# Patient Record
Sex: Female | Born: 1955 | Race: Black or African American | Hispanic: No | Marital: Single | Smoking: Former smoker
Health system: Southern US, Community
[De-identification: ages and names within clinical notes are randomized; demographics above are authoritative.]

## PROBLEM LIST (undated history)

## (undated) DIAGNOSIS — I10 Essential (primary) hypertension: Secondary | ICD-10-CM

## (undated) DIAGNOSIS — J302 Other seasonal allergic rhinitis: Secondary | ICD-10-CM

## (undated) DIAGNOSIS — T7840XA Allergy, unspecified, initial encounter: Secondary | ICD-10-CM

## (undated) HISTORY — DX: Allergy, unspecified, initial encounter: T78.40XA

---

## 1999-09-09 ENCOUNTER — Ambulatory Visit (HOSPITAL_COMMUNITY): Admission: RE | Admit: 1999-09-09 | Discharge: 1999-09-09 | Payer: Self-pay | Admitting: Family Medicine

## 2010-10-12 ENCOUNTER — Ambulatory Visit: Payer: Self-pay | Admitting: Interventional Radiology

## 2010-10-12 ENCOUNTER — Emergency Department (HOSPITAL_BASED_OUTPATIENT_CLINIC_OR_DEPARTMENT_OTHER): Admission: EM | Admit: 2010-10-12 | Discharge: 2010-10-12 | Payer: Self-pay | Admitting: Emergency Medicine

## 2014-02-27 ENCOUNTER — Encounter (HOSPITAL_BASED_OUTPATIENT_CLINIC_OR_DEPARTMENT_OTHER): Payer: Self-pay | Admitting: Emergency Medicine

## 2014-02-27 ENCOUNTER — Emergency Department (HOSPITAL_BASED_OUTPATIENT_CLINIC_OR_DEPARTMENT_OTHER)
Admission: EM | Admit: 2014-02-27 | Discharge: 2014-02-27 | Disposition: A | Discharge status: Home / Self Care | Payer: 59 | Attending: Emergency Medicine | Admitting: Emergency Medicine

## 2014-02-27 DIAGNOSIS — Z87891 Personal history of nicotine dependence: Secondary | ICD-10-CM | POA: Insufficient documentation

## 2014-02-27 DIAGNOSIS — Z79899 Other long term (current) drug therapy: Secondary | ICD-10-CM | POA: Insufficient documentation

## 2014-02-27 DIAGNOSIS — I10 Essential (primary) hypertension: Secondary | ICD-10-CM | POA: Insufficient documentation

## 2014-02-27 HISTORY — DX: Other seasonal allergic rhinitis: J30.2

## 2014-02-27 HISTORY — DX: Essential (primary) hypertension: I10

## 2014-02-27 NOTE — ED Notes (Signed)
Pt noticed the artery in the left side of her neck visibly pulsating the past two days.  Wants to be checked for excessively high bp.

## 2014-02-27 NOTE — ED Notes (Signed)
MD at bedside. 

## 2014-02-27 NOTE — ED Provider Notes (Signed)
CSN: [id number]     Arrival date & time 02/27/14  0127 History   First MD Initiated Contact with Patient 02/27/14 0144     Chief Complaint  Patient presents with  . Hypertension     (Consider location/radiation/quality/duration/timing/severity/associated sxs/prior Treatment) Patient is a 58 y.o. female presenting with hypertension. The history is provided by the patient.  Hypertension This is a chronic problem. The current episode started more than 1 week ago. The problem has not changed since onset.Pertinent negatives include no chest pain, no abdominal pain, no headaches and no shortness of breath. Associated symptoms comments: No weakness no numbness no changes in vision or speech. Nothing aggravates the symptoms. Nothing relieves the symptoms. She has tried nothing for the symptoms. The treatment provided no relief.  Patient noticed the vein in her neck pulsating and thought her BP was up and wanted it checked  Past Medical History  Diagnosis Date  . Hypertension   . Seasonal allergies    History reviewed. No pertinent past surgical history. No family history on file. History  Substance Use Topics  . Smoking status: Former Games developermoker  . Smokeless tobacco: Not on file  . Alcohol Use: No   OB History   Grav Para Term Preterm Abortions TAB SAB Ect Mult Living                 Review of Systems  Constitutional: Negative for fever and diaphoresis.  Respiratory: Negative for chest tightness and shortness of breath.   Cardiovascular: Negative for chest pain and palpitations.  Gastrointestinal: Negative for abdominal pain.  Musculoskeletal: Negative for neck pain and neck stiffness.  Neurological: Negative for syncope, speech difficulty, numbness and headaches.  All other systems reviewed and are negative.      Allergies  Review of patient's allergies indicates no known allergies.  Home Medications   Current Outpatient Rx  Name  Route  Sig  Dispense  Refill  . amLODipine  (NORVASC) 5 MG tablet   Oral   Take 5 mg by mouth daily.         Marland Kitchen. lisinopril-hydrochlorothiazide (PRINZIDE,ZESTORETIC) 20-25 MG per tablet   Oral   Take 1 tablet by mouth daily.          BP 163/84  Pulse 70  Temp(Src) 98.1 F (36.7 C) (Oral)  Resp 18  Ht 5\' 5"  (1.651 m)  Wt 192 lb (87.091 kg)  BMI 31.95 kg/m2  SpO2 100% Physical Exam  Constitutional: She is oriented to person, place, and time. She appears well-developed and well-nourished. No distress.  HENT:  Head: Normocephalic and atraumatic.  Eyes: Conjunctivae are normal. Pupils are equal, round, and reactive to light.  Neck: Normal range of motion. Neck supple. No JVD present. No tracheal deviation present. No thyromegaly present.  Veins of the neck are flat.  No bruits.    Cardiovascular: Normal rate, regular rhythm and intact distal pulses.   Pulmonary/Chest: Effort normal and breath sounds normal. No stridor. No respiratory distress. She has no wheezes. She has no rales.  Abdominal: Soft. Bowel sounds are normal. There is no tenderness. There is no rebound and no guarding.  Musculoskeletal: Normal range of motion. She exhibits no edema and no tenderness.  Lymphadenopathy:    She has no cervical adenopathy.  Neurological: She is alert and oriented to person, place, and time. She has normal reflexes. She displays normal reflexes. She exhibits normal muscle tone.  Neurovascularly intact BUE  Skin: Skin is warm and dry.  Psychiatric: She  has a normal mood and affect.    ED Course  Procedures (including critical care time) Labs Review Labs Reviewed - No data to display Imaging Review No results found.   EKG Interpretation None      MDM   Final diagnoses:  None   No pulsating veins.  Recommend outpatient ultrasound of the neck and follow up with your family doctor in 2 days.  Suspect sodium and caffeine intake may play some part.  Have advised less than 2 grams of sodium a day and no caffeine until being  seen by family doctor.  Patient verbalizes understanding and agrees to follow up  Correy Weidner K Chritopher Coster-Rasch, MD 02/27/14 [phone]

## 2014-02-27 NOTE — Discharge Instructions (Signed)
DASH Diet  The DASH diet stands for "Dietary Approaches to Stop Hypertension." It is a healthy eating plan that has been shown to reduce high blood pressure (hypertension) in as little as 14 days, while also possibly providing other significant health benefits. These other health benefits include reducing the risk of breast cancer after menopause and reducing the risk of type 2 diabetes, heart disease, colon cancer, and stroke. Health benefits also include weight loss and slowing kidney failure in patients with chronic kidney disease.   DIET GUIDELINES  · Limit salt (sodium). Your diet should contain less than 1500 mg of sodium daily.  · Limit refined or processed carbohydrates. Your diet should include mostly whole grains. Desserts and added sugars should be used sparingly.  · Include small amounts of heart-healthy fats. These types of fats include nuts, oils, and tub margarine. Limit saturated and trans fats. These fats have been shown to be harmful in the body.  CHOOSING FOODS   The following food groups are based on a 2000 calorie diet. See your Registered Dietitian for individual calorie needs.  Grains and Grain Products (6 to 8 servings daily)  · Eat More Often: Whole-wheat bread, brown rice, whole-grain or wheat pasta, quinoa, popcorn without added fat or salt (air popped).  · Eat Less Often: White bread, white pasta, white rice, cornbread.  Vegetables (4 to 5 servings daily)  · Eat More Often: Fresh, frozen, and canned vegetables. Vegetables may be raw, steamed, roasted, or grilled with a minimal amount of fat.  · Eat Less Often/Avoid: Creamed or fried vegetables. Vegetables in a cheese sauce.  Fruit (4 to 5 servings daily)  · Eat More Often: All fresh, canned (in natural juice), or frozen fruits. Dried fruits without added sugar. One hundred percent fruit juice (½ cup [237 mL] daily).  · Eat Less Often: Dried fruits with added sugar. Canned fruit in light or heavy syrup.  Lean Meats, Fish, and Poultry (2  servings or less daily. One serving is 3 to 4 oz [85-114 g]).  · Eat More Often: Ninety percent or leaner ground beef, tenderloin, sirloin. Round cuts of beef, chicken breast, turkey breast. All fish. Grill, bake, or broil your meat. Nothing should be fried.  · Eat Less Often/Avoid: Fatty cuts of meat, turkey, or chicken leg, thigh, or wing. Fried cuts of meat or fish.  Dairy (2 to 3 servings)  · Eat More Often: Low-fat or fat-free milk, low-fat plain or light yogurt, reduced-fat or part-skim cheese.  · Eat Less Often/Avoid: Milk (whole, 2%). Whole milk yogurt. Full-fat cheeses.  Nuts, Seeds, and Legumes (4 to 5 servings per week)  · Eat More Often: All without added salt.  · Eat Less Often/Avoid: Salted nuts and seeds, canned beans with added salt.  Fats and Sweets (limited)  · Eat More Often: Vegetable oils, tub margarines without trans fats, sugar-free gelatin. Mayonnaise and salad dressings.  · Eat Less Often/Avoid: Coconut oils, palm oils, butter, stick margarine, cream, half and half, cookies, candy, pie.  FOR MORE INFORMATION  The Dash Diet Eating Plan: www.dashdiet.org  Document Released: 11/12/2011 Document Revised: 02/15/2012 Document Reviewed: 11/12/2011  [employer]® Patient Information ©2014 [employer].

## 2015-12-19 ENCOUNTER — Ambulatory Visit (INDEPENDENT_AMBULATORY_CARE_PROVIDER_SITE_OTHER): Payer: BLUE CROSS/BLUE SHIELD | Admitting: Physician Assistant

## 2015-12-19 VITALS — BP 124/80 | HR 79 | Temp 98.1°F | Resp 18 | Ht 66.0 in | Wt 190.0 lb

## 2015-12-19 DIAGNOSIS — Z0000 Encounter for general adult medical examination without abnormal findings: Secondary | ICD-10-CM

## 2015-12-19 DIAGNOSIS — Z7689 Persons encountering health services in other specified circumstances: Secondary | ICD-10-CM

## 2015-12-19 NOTE — Progress Notes (Signed)
[facility] [address], [place] [id number] [phone]  Date:  12/19/2015   Name:  Katherine NewerJanice F Salas   DOB:  05-Feb-1956   MRN:  [id number]  PCP:  Altamese CarolinaMARTIN,TANYA D, MD    History of Present Illness:  Katherine Salas is a 60 y.o. female patient who [facility] for work note.   Patient reports that last Thursday, patient fell back and caught her left arm.  This was days ago.  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  She had a sore back and left hand pain.  She missed the following weekend due to her pain, and finger swelling.  This has since resolved.  Her work has advised that she be cleared by a medical provider.   She has used epsom salt for her pain, which helped to   There are no active problems to display for this patient.   Past Medical History  Diagnosis Date  . Hypertension   . Seasonal allergies   . Allergy     History reviewed. No pertinent past surgical history.  Social History  Substance Use Topics  . Smoking status: Former Games developermoker  . Smokeless tobacco: None  . Alcohol Use: No    Family History  Problem Relation Age of Onset  . Cancer Mother   . Cancer Maternal Grandfather     No Known Allergies  Medication list has been reviewed and updated.  Current Outpatient Prescriptions on File Prior to Visit  Medication Sig Dispense Refill  . lisinopril-hydrochlorothiazide (PRINZIDE,ZESTORETIC) 20-25 MG per tablet Take 1 tablet by mouth daily.    Marland Kitchen. amLODipine (NORVASC) 5 MG tablet Take 5 mg by mouth daily. Reported on 12/19/2015     No current facility-administered medications on file prior to visit.    ROS   Physical Examination: BP 124/80 mmHg  Pulse 79  Temp(Src) 98.1 F (36.7 C) (Oral)  Resp 18  Ht 5\' 6"  (1.676 m)  Wt 190 lb (86.183 kg)  BMI 30.68 kg/m2  SpO2 98% Ideal Body Weight: Weight in (lb) to have BMI = 25: 154.6  Physical Exam  Constitutional: She is oriented to person, place, and time. She appears  well-developed and well-nourished. No distress.  HENT:  Head: Normocephalic and atraumatic.  Right Ear: External ear normal.  Left Ear: External ear normal.  Eyes: Conjunctivae and EOM are normal. Pupils are equal, round, and reactive to light.  Cardiovascular: Normal rate.   Pulmonary/Chest: Effort normal. No respiratory distress.  Musculoskeletal:  No swelling, redness, or abrasions at the left hand.  Normal range of motion and strength throught   Neurological: She is alert and oriented to person, place, and time.  Skin: She is not diaphoretic.  Psychiatric: She has a normal mood and affect. Her behavior is normal.     Assessment and Plan: Katherine NewerJanice F Purnell is a 60 y.o. female who is here today for a work note after a fall and missed work.   Completed note  Return to work exam   Trena PlattStephanie Rilie Glanz, PA-C [facility] 12/19/2015 1:12 PM

## 2015-12-19 NOTE — Patient Instructions (Signed)
You are able to return to work today, or next scheduled shift.

## 2016-07-08 ENCOUNTER — Encounter (HOSPITAL_COMMUNITY): Payer: Self-pay | Admitting: Emergency Medicine

## 2016-07-08 ENCOUNTER — Emergency Department (HOSPITAL_COMMUNITY): Payer: Self-pay

## 2016-07-08 ENCOUNTER — Emergency Department (HOSPITAL_COMMUNITY)
Admission: EM | Admit: 2016-07-08 | Discharge: 2016-07-08 | Disposition: A | Discharge status: Home / Self Care | Payer: Self-pay | Attending: Emergency Medicine | Admitting: Emergency Medicine

## 2016-07-08 DIAGNOSIS — Y939 Activity, unspecified: Secondary | ICD-10-CM | POA: Insufficient documentation

## 2016-07-08 DIAGNOSIS — Z87891 Personal history of nicotine dependence: Secondary | ICD-10-CM | POA: Insufficient documentation

## 2016-07-08 DIAGNOSIS — Y92410 Unspecified street and highway as the place of occurrence of the external cause: Secondary | ICD-10-CM | POA: Insufficient documentation

## 2016-07-08 DIAGNOSIS — Y999 Unspecified external cause status: Secondary | ICD-10-CM | POA: Insufficient documentation

## 2016-07-08 DIAGNOSIS — M791 Myalgia: Secondary | ICD-10-CM | POA: Insufficient documentation

## 2016-07-08 DIAGNOSIS — M7918 Myalgia, other site: Secondary | ICD-10-CM

## 2016-07-08 DIAGNOSIS — I10 Essential (primary) hypertension: Secondary | ICD-10-CM | POA: Insufficient documentation

## 2016-07-08 DIAGNOSIS — I672 Cerebral atherosclerosis: Secondary | ICD-10-CM | POA: Insufficient documentation

## 2016-07-08 LAB — I-STAT BETA HCG BLOOD, ED (MC, WL, AP ONLY): HCG, QUANTITATIVE: 5.1 m[IU]/mL — AB (ref <5)

## 2016-07-08 MED ORDER — ACETAMINOPHEN 500 MG PO TABS
1000.0000 mg | ORAL_TABLET | Freq: Once | ORAL | Status: AC
  Administered 2016-07-08: 1000 mg via ORAL
  Filled 2016-07-08: qty 2

## 2016-07-08 NOTE — ED Notes (Signed)
Pt ambulated to restroom without any assistance. Pt was steady on her feet
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
## 2016-07-08 NOTE — ED Notes (Signed)
Patient was alert, oriented and stable upon discharge. RN went over AVS and patient had no further questions.  
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
## 2016-07-08 NOTE — ED Notes (Signed)
ED PA at bedside

## 2016-07-08 NOTE — ED Notes (Signed)
Pt provided with cheese, crackers and coke.
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
## 2016-07-08 NOTE — ED Notes (Signed)
Patient transported to X-ray 
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
## 2017-09-29 ENCOUNTER — Ambulatory Visit: Payer: BLUE CROSS/BLUE SHIELD | Admitting: Physician Assistant

## 2017-11-03 ENCOUNTER — Ambulatory Visit: Payer: PRIVATE HEALTH INSURANCE | Admitting: Physician Assistant

## 2017-11-03 ENCOUNTER — Encounter: Payer: Self-pay | Admitting: Physician Assistant

## 2017-11-03 ENCOUNTER — Other Ambulatory Visit: Payer: Self-pay

## 2017-11-03 VITALS — BP 180/106 | HR 72 | Temp 98.3°F | Resp 16 | Ht 65.5 in | Wt 182.4 lb

## 2017-11-03 DIAGNOSIS — I10 Essential (primary) hypertension: Secondary | ICD-10-CM | POA: Diagnosis not present

## 2017-11-03 DIAGNOSIS — R030 Elevated blood-pressure reading, without diagnosis of hypertension: Secondary | ICD-10-CM

## 2017-11-03 MED ORDER — LISINOPRIL-HYDROCHLOROTHIAZIDE 20-25 MG PO TABS: 1.0000 | ORAL_TABLET | Freq: Every day | ORAL | 4 refills | Status: DC

## 2017-11-03 MED ORDER — AMLODIPINE BESYLATE 5 MG PO TABS: 5.0000 mg | ORAL_TABLET | Freq: Every day | ORAL | 4 refills | Status: DC

## 2017-11-03 NOTE — Patient Instructions (Addendum)
Please make an appointment for your annual exam. We need to check your cholesterol, kidney and liver function.   Thank you for coming in today. I hope you feel we met your needs. Feel free to call PCP if you have any questions or further requests. Please consider signing up for MyChart if you do not already have it, as this is a great way to communicate with me.  Best,  Whitney McVey, PA-C  DASH Eating Plan DASH stands for "Dietary Approaches to Stop Hypertension." The DASH eating plan is a healthy eating plan that has been shown to reduce high blood pressure (hypertension). It may also reduce your risk for type 2 diabetes, heart disease, and stroke. The DASH eating plan may also help with weight loss. What are tips for following this plan? General guidelines  Avoid eating more than 2,300 mg (milligrams) of salt (sodium) a day. If you have hypertension, you may need to reduce your sodium intake to 1,500 mg a day.  Limit alcohol intake to no more than 1 drink a day for nonpregnant women and 2 drinks a day for men. One drink equals 12 oz of beer, 5 oz of wine, or 1 oz of hard liquor.  Work with your health care provider to maintain a healthy body weight or to lose weight. Ask what an ideal weight is for you.  Get at least 30 minutes of exercise that causes your heart to beat faster (aerobic exercise) most days of the week. Activities may include walking, swimming, or biking.  Work with your health care provider or diet and nutrition specialist (dietitian) to adjust your eating plan to your individual calorie needs. Reading food labels  Check food labels for the amount of sodium per serving. Choose foods with less than 5 percent of the Daily Value of sodium. Generally, foods with less than 300 mg of sodium per serving fit into this eating plan.  To find whole grains, look for the word "whole" as the first word in the ingredient list. Shopping  Buy products labeled as "low-sodium" or "no  salt added."  Buy fresh foods. Avoid canned foods and premade or frozen meals. Cooking  Avoid adding salt when cooking. Use salt-free seasonings or herbs instead of table salt or sea salt. Check with your health care provider or pharmacist before using salt substitutes.  Do not fry foods. Cook foods using healthy methods such as baking, boiling, grilling, and broiling instead.  Cook with heart-healthy oils, such as olive, canola, soybean, or sunflower oil. Meal planning   Eat a balanced diet that includes: ? 5 or more servings of fruits and vegetables each day. At each meal, try to fill half of your plate with fruits and vegetables. ? Up to 6-8 servings of whole grains each day. ? Less than 6 oz of lean meat, poultry, or fish each day. A 3-oz serving of meat is about the same size as a deck of cards. One egg equals 1 oz. ? 2 servings of low-fat dairy each day. ? A serving of nuts, seeds, or beans 5 times each week. ? Heart-healthy fats. Healthy fats called Omega-3 fatty acids are found in foods such as flaxseeds and coldwater fish, like sardines, salmon, and mackerel.  Limit how much you eat of the following: ? Canned or prepackaged foods. ? Food that is high in trans fat, such as fried foods. ? Food that is high in saturated fat, such as fatty meat. ? Sweets, desserts, sugary drinks, and other  foods with added sugar. ? Full-fat dairy products.  Do not salt foods before eating.  Try to eat at least 2 vegetarian meals each week.  Eat more home-cooked food and less restaurant, buffet, and fast food.  When eating at a restaurant, ask that your food be prepared with less salt or no salt, if possible. What foods are recommended? The items listed may not be a complete list. Talk with your dietitian about what dietary choices are best for you. Grains Whole-grain or whole-wheat bread. Whole-grain or whole-wheat pasta. Brown rice. [place]ena Morrow. Bulgur. Whole-grain and low-sodium  cereals. Pita bread. Low-fat, low-sodium crackers. Whole-wheat flour tortillas. Vegetables Fresh or frozen vegetables (raw, steamed, roasted, or grilled). Low-sodium or reduced-sodium tomato and vegetable juice. Low-sodium or reduced-sodium tomato sauce and tomato paste. Low-sodium or reduced-sodium canned vegetables. Fruits All fresh, dried, or frozen fruit. Canned fruit in natural juice (without added sugar). Meat and other protein foods Skinless chicken or [place]. Ground chicken or [place]. Pork with fat trimmed off. Fish and seafood. Egg whites. Dried beans, peas, or lentils. Unsalted nuts, nut butters, and seeds. Unsalted canned beans. Lean cuts of beef with fat trimmed off. Low-sodium, lean deli meat. Dairy Low-fat (1%) or fat-free (skim) milk. Fat-free, low-fat, or reduced-fat cheeses. Nonfat, low-sodium ricotta or cottage cheese. Low-fat or nonfat yogurt. Low-fat, low-sodium cheese. Fats and oils Soft margarine without trans fats. Vegetable oil. Low-fat, reduced-fat, or light mayonnaise and salad dressings (reduced-sodium). Canola, safflower, olive, soybean, and sunflower oils. Avocado. Seasoning and other foods Herbs. Spices. Seasoning mixes without salt. Unsalted popcorn and pretzels. Fat-free sweets. What foods are not recommended? The items listed may not be a complete list. Talk with your dietitian about what dietary choices are best for you. Grains Baked goods made with fat, such as croissants, muffins, or some breads. Dry pasta or rice meal packs. Vegetables Creamed or fried vegetables. Vegetables in a cheese sauce. Regular canned vegetables (not low-sodium or reduced-sodium). Regular canned tomato sauce and paste (not low-sodium or reduced-sodium). Regular tomato and vegetable juice (not low-sodium or reduced-sodium). Angie Fava. Olives. Fruits Canned fruit in a light or heavy syrup. Fried fruit. Fruit in cream or butter sauce. Meat and other protein foods Fatty cuts of meat. Ribs.  Fried meat. Berniece Salines. Sausage. Bologna and other processed lunch meats. Salami. Fatback. Hotdogs. Bratwurst. Salted nuts and seeds. Canned beans with added salt. Canned or smoked fish. Whole eggs or egg yolks. Chicken or [place] with skin. Dairy Whole or 2% milk, cream, and half-and-half. Whole or full-fat cream cheese. Whole-fat or sweetened yogurt. Full-fat cheese. Nondairy creamers. Whipped toppings. Processed cheese and cheese spreads. Fats and oils Butter. Stick margarine. Lard. Shortening. Ghee. Bacon fat. Tropical oils, such as coconut, palm kernel, or palm oil. Seasoning and other foods Salted popcorn and pretzels. Onion salt, garlic salt, seasoned salt, table salt, and sea salt. Worcestershire sauce. Tartar sauce. Barbecue sauce. Teriyaki sauce. Soy sauce, including reduced-sodium. Steak sauce. Canned and packaged gravies. Fish sauce. Oyster sauce. Cocktail sauce. Horseradish that you find on the shelf. Ketchup. Mustard. Meat flavorings and tenderizers. Bouillon cubes. Hot sauce and Tabasco sauce. Premade or packaged marinades. Premade or packaged taco seasonings. Relishes. Regular salad dressings. Where to find more information:  National Heart, Lung, and [place]: https://wilson-eaton.com/  American Heart Association: www.heart.org Summary  The DASH eating plan is a healthy eating plan that has been shown to reduce high blood pressure (hypertension). It may also reduce your risk for type 2 diabetes, heart disease, and stroke.  With  the DASH eating plan, you should limit salt (sodium) intake to 2,300 mg a day. If you have hypertension, you may need to reduce your sodium intake to 1,500 mg a day.  When on the DASH eating plan, aim to eat more fresh fruits and vegetables, whole grains, lean proteins, low-fat dairy, and heart-healthy fats.  Work with your health care provider or diet and nutrition specialist (dietitian) to adjust your eating plan to your individual calorie needs. This  information is not intended to replace advice given to you by your health care provider. Make sure you discuss any questions you have with your health care provider. Document Released: 11/12/2011 Document Revised: 11/16/2016 Document Reviewed: 11/16/2016 Elsevier Interactive Patient Education  2017 [employer].   IF you received an x-ray today, you will receive an invoice from [facility]. Please contact [facility] at [phone] with questions or concerns regarding your invoice.   IF you received labwork today, you will receive an invoice from [facility]. Please contact [facility] at [phone] with questions or concerns regarding your invoice.   Our billing staff will not be able to assist you with questions regarding bills from these companies.  You will be contacted with the lab results as soon as they are available. The fastest way to get your results is to activate your My Chart account. Instructions are located on the last page of this paperwork. If you have not heard from [place] regarding the results in 2 weeks, please contact this office.

## 2017-11-03 NOTE — Progress Notes (Signed)
Ellouise NewerJanice F Simerson  MRN: [id number] DOB: Mar 07, 1956  PCP: Alwyn PeaMartin, Tanya, MD  Subjective:  Pt is 61 year old female PMH HTN presents to clinic for blood pressure check.  HTN - She is out of her medication x several weeks. Prinzide 20-25 mg qd and Norvasc 5mg  qd. Recently got a new job and new insurance. Today's blood pressure is 180/106. Recheck bp is unchanged.  Denies headache, chest pain, palpitations, vision changes, blood in her urine.  She has lost 8 lbs since her last visit.  Dietary changes - eating less red meat, cut out soda, cut back on carbs She is walking around a local park.   Trying to get Dr. Dallas Schimkeopeland as her PCP - has not yet made appt. Would like to receive care there. Does not want lab work done today.  She has never had a colonoscopy. Declines referral today.   Review of Systems  Constitutional: Negative for chills, diaphoresis, fatigue and fever.  Respiratory: Negative for cough, chest tightness, shortness of breath and wheezing.   Cardiovascular: Negative for chest pain, palpitations and leg swelling.  Gastrointestinal: Negative for abdominal pain, nausea and vomiting.  Musculoskeletal: Negative for arthralgias and neck pain.  Neurological: Negative for dizziness, light-headedness and headaches.    There are no active problems to display for this patient.   Current Outpatient Medications on File Prior to Visit  Medication Sig Dispense Refill  . lisinopril-hydrochlorothiazide (PRINZIDE,ZESTORETIC) 20-25 MG per tablet Take 1 tablet by mouth daily.    Marland Kitchen. amLODipine (NORVASC) 5 MG tablet Take 5 mg by mouth daily. Reported on 12/19/2015     No current facility-administered medications on file prior to visit.     No Known Allergies   Objective:  BP (!) 180/106   Pulse 72   Temp 98.3 F (36.8 C) (Oral)   Resp 16   Ht 5' 5.5" (1.664 m)   Wt 182 lb 6.4 oz (82.7 kg)   SpO2 99%   BMI 29.89 kg/m   Physical Exam  Constitutional: She is oriented to person, place,  and time and well-developed, well-nourished, and in no distress. No distress.  Neck: Carotid bruit is not present.  Cardiovascular: Normal rate, regular rhythm, S1 normal, S2 normal, normal heart sounds and normal pulses.  Musculoskeletal:       Right ankle: She exhibits no swelling.       Left ankle: She exhibits no swelling.       Right lower leg: She exhibits no edema.       Left lower leg: She exhibits no edema.  Neurological: She is alert and oriented to person, place, and time. GCS score is 15.  Skin: Skin is warm and dry.  Psychiatric: Mood, memory, affect and judgment normal.  Vitals reviewed.   Assessment and Plan :  1. Hypertension, unspecified type 2. Elevated blood pressure reading - Recheck vitals - amLODipine (NORVASC) 5 MG tablet; Take 1 tablet (5 mg total) by mouth daily. Reported on 12/19/2015  Dispense: 90 tablet; Refill: 4 - lisinopril-hydrochlorothiazide (PRINZIDE,ZESTORETIC) 20-25 MG tablet; Take 1 tablet by mouth daily.  Dispense: 90 tablet; Refill: 4 - Pt is here for refill of her blood pressure medication. Due to last of insurance, she ran out of medication. She is trying to establish care with Dr. Dallas Schimkeopeland. Declines blood work today. If she cannot get appt with Dr. Dallas Schimkeopeland, she will come back to PCP for recheck and blood work in 4-6 weeks.   Marco CollieWhitney Anarosa Kubisiak, PA-C  Primary Care  at [facility] 11/03/2017 2:38 PM

## 2019-04-25 ENCOUNTER — Telehealth: Payer: Self-pay | Admitting: Physician Assistant

## 2019-04-25 DIAGNOSIS — I10 Essential (primary) hypertension: Secondary | ICD-10-CM

## 2019-04-25 NOTE — Telephone Encounter (Signed)
Requested medication (s) are due for refill today: yes  Requested medication (s) are on the active medication list: yes  Last refill:  11/03/17 for both medications requested  Future visit scheduled: no  Notes to clinic:  Called number in chart and no answer.   Requested Prescriptions  Pending Prescriptions Disp Refills   lisinopril-hydrochlorothiazide (ZESTORETIC) 20-25 MG tablet [Pharmacy Med Name: Lisinopril-hydroCHLOROthiazide 20-25 MG Oral Tablet] 90 tablet 0    Sig: Take 1 tablet by mouth once daily     Cardiovascular:  ACEI + Diuretic Combos Failed - 04/25/2019  4:03 PM      Failed - Na in normal range and within 180 days    No results found for: NA       Failed - K in normal range and within 180 days    No results found for: K, POTASSIUM       Failed - Cr in normal range and within 180 days    No results found for: CREATININE       Failed - Ca in normal range and within 180 days    No results found for: CALCIUM, CORRECTEDCA, CAWHOLEBLD, POCCA       Failed - Last BP in normal range    BP Readings from Last 1 Encounters:  11/03/17 (!) 180/106         Failed - Valid encounter within last 6 months    Recent Outpatient Visits          1 year ago Hypertension, unspecified type   Primary Care at [facility], Madelaine Bhat, PA-C   3 years ago Return to work exam   Primary Care at [place], [place], [place]             PVVZSM - Patient is not pregnant     amLODipine (NORVASC) 5 MG tablet [Pharmacy Med Name: amLODIPine Besylate 5 MG Oral Tablet] 90 tablet 0    Sig: Take 1 tablet by mouth once daily     Cardiovascular:  Calcium Channel Blockers Failed - 04/25/2019  4:03 PM      Failed - Last BP in normal range    BP Readings from Last 1 Encounters:  11/03/17 (!) 180/106         Failed - Valid encounter within last 6 months    Recent Outpatient Visits          1 year ago Hypertension, unspecified type   Primary Care at [facility], Madelaine Bhat, PA-C   3 years ago Return to work exam   Primary Care at [facility], [place], [place]

## 2019-06-02 NOTE — Telephone Encounter (Signed)
[facility] pharmacy calling to request this refill.  Advised pt not seen since 10/2017 and would need to call office and make appt. ceasar states he will let her know!

## 2019-06-02 NOTE — Telephone Encounter (Signed)
Please call pt and schedule appt for b/p and med refill Thank you

## 2019-06-02 NOTE — Telephone Encounter (Signed)
CAN NOT GET IN CONTACT WITH PATIENT THAT IS A WORK NUMBER AND THEY WERE NOT COOPERATING

## 2019-06-20 ENCOUNTER — Other Ambulatory Visit: Payer: Self-pay | Admitting: Physician Assistant

## 2019-06-20 DIAGNOSIS — I10 Essential (primary) hypertension: Secondary | ICD-10-CM

## 2019-06-21 NOTE — Telephone Encounter (Signed)
Requested medication (s) are due for refill today: lisinopril-hctz no  Requested medication (s) are on the active medication list: yes  Last refill:  10/21/18  Future visit scheduled: no  Notes to clinic: last office visit 11/03/17  Requested medication (s) are due for refill today: norvasc no  Requested medication (s) are on the active medication list: yes  Last refill: 10/21/18   Future visit scheduled: no  Notes to clinic: last office visit 11/03/17     Requested Prescriptions  Pending Prescriptions Disp Refills   lisinopril-hydrochlorothiazide (ZESTORETIC) 20-25 MG tablet [Pharmacy Med Name: Lisinopril-hydroCHLOROthiazide 20-25 MG Oral Tablet] 90 tablet 0    Sig: Take 1 tablet by mouth once daily     Cardiovascular:  ACEI + Diuretic Combos Failed - 06/20/2019  6:32 PM      Failed - Na in normal range and within 180 days    No results found for: NA       Failed - K in normal range and within 180 days    No results found for: K, POTASSIUM       Failed - Cr in normal range and within 180 days    No results found for: CREATININE       Failed - Ca in normal range and within 180 days    No results found for: CALCIUM, CORRECTEDCA, CAWHOLEBLD, POCCA       Failed - Last BP in normal range    BP Readings from Last 1 Encounters:  11/03/17 (!) 180/106         Failed - Valid encounter within last 6 months    Recent Outpatient Visits          1 year ago Hypertension, unspecified type   Primary Care at [facility], Gelene Mink, PA-C   3 years ago Return to work exam   Primary Care at [place] [place], Garza - Patient is not pregnant       amLODipine (NORVASC) 5 MG tablet [Pharmacy Med Name: amLODIPine Besylate 5 MG Oral Tablet] 90 tablet 0    Sig: Take 1 tablet by mouth once daily     Cardiovascular:  Calcium Channel Blockers Failed - 06/20/2019  6:32 PM      Failed - Last BP in normal range    BP Readings from Last 1 Encounters:   11/03/17 (!) 180/106         Failed - Valid encounter within last 6 months    Recent Outpatient Visits          1 year ago Hypertension, unspecified type   Primary Care at [facility], Gelene Mink, PA-C   3 years ago Return to work exam   Primary Care at [facility], [place], [place]

## 2020-08-07 ENCOUNTER — Encounter: Payer: Self-pay | Admitting: *Deleted

## 2020-08-07 ENCOUNTER — Ambulatory Visit: Payer: Self-pay | Attending: Oncology | Admitting: *Deleted

## 2020-08-07 ENCOUNTER — Ambulatory Visit
Admission: RE | Admit: 2020-08-07 | Discharge: 2020-08-07 | Disposition: A | Discharge status: Home / Self Care | Payer: Self-pay | Source: Ambulatory Visit | Attending: Oncology | Admitting: Oncology

## 2020-08-07 ENCOUNTER — Other Ambulatory Visit: Payer: Self-pay

## 2020-08-07 VITALS — BP 167/93 | HR 84 | Temp 98.2°F | Ht 63.17 in | Wt 190.7 lb

## 2020-08-07 DIAGNOSIS — Z0000 Encounter for general adult medical examination without abnormal findings: Secondary | ICD-10-CM

## 2020-08-07 NOTE — Patient Instructions (Signed)
Gave patient hand-out, Women Staying Healthy, Active and Well from [facility], with education on breast health, pap smears, heart and colon health. 

## 2020-08-07 NOTE — Progress Notes (Signed)
Subjective:     Patient ID: Katherine Salas, female   DOB: 10-29-1956, 64 y.o.   MRN: [id number]  HPI   [facility] Medical History Record - 08/07/20 9417      Breast History   Screening cycle New    Provider ([facility]) none    Initial Mammogram 08/07/20   over 10 years ago at [facility]   Last Mammogram --   [facility]   Provider (Mammogram)  [facility]    Recent Breast Symptoms None      Breast Cancer History   Breast Cancer History No personal or family history      Previous History of Breast Problems   Breast Surgery or Biopsy Right   cyst aspiration at age 47   Breast Implants N/A    BSE Done Monthly      Gynecological/Obstetrical History   LMP --   64 yo   Age at menarche 32    Age at menopause 8's    PAP smear history Annually    Date of last PAP  --   >10 Years   Age at first live birth 28    Breast fed children No    DES Exposure No    Cervical, Uterine or Ovarian cancer No    Family history of Cervial, Uterine or Ovarian cancer Yes   mother-30's;   Hysterectomy No    Cervix removed No    Ovaries removed No    Laser/Cryosurgery No    Current method of birth control None    Current method of Estrogen/Hormone replacement None    Smoking history None   quit 18 years ago            Review of Systems     Objective:   Physical Exam Chest:     Breasts:        Right: No swelling, bleeding, inverted nipple, mass, nipple discharge or skin change.        Left: No swelling, bleeding, inverted nipple, mass, nipple discharge, skin change or tenderness.    Abdominal:     Palpations: There is no hepatomegaly or splenomegaly.  Genitourinary:    Exam position: Lithotomy position.     Labia:        Right: No rash, tenderness, lesion or injury.        Left: No rash, tenderness, lesion or injury.      Urethra: No prolapse, urethral pain or urethral swelling.     Vagina: Normal. No signs of injury and foreign body. No vaginal discharge, tenderness,  bleeding, lesions or prolapsed vaginal walls.     Cervix: Normal. No eversion.     Uterus: Not deviated, not enlarged, not fixed, not tender and no uterine prolapse.      Adnexa:        Right: No mass.         Left: No mass.          Comments: Refused rectal exam Lymphadenopathy:     Upper Body:     Right upper body: No supraclavicular or axillary adenopathy.     Left upper body: No supraclavicular or axillary adenopathy.        Assessment:     64 year old Black female presents to [facility] for clinical breast exam, pap and mammogram.  Clinical breast exam with bilateral fibroglandular like tissue.  Taught self breast awareness.  Specimen collected for pap smear without difficulty.  Patient refused rectal exam.  Blood  pressure elevated at 167/93.  She is out of  her blood pressure meds and her PCP has moved.  Called to get appointment with new provider. She is scheduled at the [facility] on 08/16/20 @ 10:40. Patient has been screened for eligibility.  She does not have any insurance, Medicare or Medicaid.  She also meets financial eligibility.   Risk Assessment    Risk Scores      08/07/2020   Last edited by: Scarlett Presto, RN   5-year risk: 1.6 %   Lifetime risk: 6.3 %            Plan:     Screening mammogram ordered.  Specimen for pap sent to the lab.  Appointment to establish new PCP on 08/16/20.  Will follow up per [facility] protocol.

## 2020-08-11 LAB — IGP, APTIMA HPV: HPV Aptima: NEGATIVE

## 2020-08-13 ENCOUNTER — Encounter: Payer: Self-pay | Admitting: *Deleted

## 2020-08-13 NOTE — Progress Notes (Signed)
Letter mailed to inform patient of her normal pap and mammogram.  Next mammo in one year and pap in 5 years.

## 2020-08-14 ENCOUNTER — Ambulatory Visit: Payer: Self-pay

## 2023-10-18 ENCOUNTER — Encounter (HOSPITAL_BASED_OUTPATIENT_CLINIC_OR_DEPARTMENT_OTHER): Payer: Self-pay | Admitting: Emergency Medicine

## 2023-10-18 ENCOUNTER — Other Ambulatory Visit (HOSPITAL_BASED_OUTPATIENT_CLINIC_OR_DEPARTMENT_OTHER): Payer: Self-pay

## 2023-10-18 ENCOUNTER — Other Ambulatory Visit: Payer: Self-pay

## 2023-10-18 ENCOUNTER — Emergency Department (HOSPITAL_BASED_OUTPATIENT_CLINIC_OR_DEPARTMENT_OTHER)
Admission: EM | Admit: 2023-10-18 | Discharge: 2023-10-18 | Disposition: A | Discharge status: Home / Self Care | Payer: Self-pay | Attending: Emergency Medicine | Admitting: Emergency Medicine

## 2023-10-18 DIAGNOSIS — Z760 Encounter for issue of repeat prescription: Secondary | ICD-10-CM | POA: Insufficient documentation

## 2023-10-18 DIAGNOSIS — I10 Essential (primary) hypertension: Secondary | ICD-10-CM | POA: Insufficient documentation

## 2023-10-18 LAB — BASIC METABOLIC PANEL
Anion gap: 8 (ref 5–15)
BUN: 19 mg/dL (ref 8–23)
CO2: 26 mmol/L (ref 22–32)
Calcium: 9.1 mg/dL (ref 8.9–10.3)
Chloride: 101 mmol/L (ref 98–111)
Creatinine, Ser: 0.78 mg/dL (ref 0.44–1.00)
GFR, Estimated: >60 mL/min (ref >60)
Glucose, Bld: 100 mg/dL — ABNORMAL HIGH (ref 70–99)
Potassium: 4.2 mmol/L (ref 3.5–5.1)
Sodium: 135 mmol/L (ref 135–145)

## 2023-10-18 LAB — CBC WITH DIFFERENTIAL/PLATELET
Abs Immature Granulocytes: 0.01 10*3/uL (ref 0.00–0.07)
Basophils Absolute: 0 10*3/uL (ref 0.0–0.1)
Basophils Relative: 1 %
Eosinophils Absolute: 0.1 10*3/uL (ref 0.0–0.5)
Eosinophils Relative: 2 %
HCT: 37.2 % (ref 36.0–46.0)
Hemoglobin: 12.2 g/dL (ref 12.0–15.0)
Immature Granulocytes: 0 %
Lymphocytes Relative: 41 %
Lymphs Abs: 2.1 10*3/uL (ref 0.7–4.0)
MCH: 29.3 pg (ref 26.0–34.0)
MCHC: 32.8 g/dL (ref 30.0–36.0)
MCV: 89.4 fL (ref 80.0–100.0)
Monocytes Absolute: 0.6 10*3/uL (ref 0.1–1.0)
Monocytes Relative: 12 %
Neutro Abs: 2.2 10*3/uL (ref 1.7–7.7)
Neutrophils Relative %: 44 %
Platelets: 357 10*3/uL (ref 150–400)
RBC: 4.16 MIL/uL (ref 3.87–5.11)
RDW: 13.7 % (ref 11.5–15.5)
WBC: 5 10*3/uL (ref 4.0–10.5)
nRBC: 0 % (ref 0.0–0.2)

## 2023-10-18 MED ORDER — AMLODIPINE BESYLATE 5 MG PO TABS
5.0000 mg | ORAL_TABLET | Freq: Every day | ORAL | 0 refills | Status: AC
  Filled 2023-10-18: qty 30, 30d supply, fill #0

## 2023-10-18 MED ORDER — AMLODIPINE BESYLATE 5 MG PO TABS
5.0000 mg | ORAL_TABLET | Freq: Once | ORAL | Status: AC
  Administered 2023-10-18: 5 mg via ORAL
  Filled 2023-10-18: qty 1

## 2023-10-18 MED ORDER — LISINOPRIL-HYDROCHLOROTHIAZIDE 20-25 MG PO TABS
1.0000 | ORAL_TABLET | Freq: Every day | ORAL | 0 refills | Status: AC
  Filled 2023-10-18: qty 30, 30d supply, fill #0

## 2023-10-18 NOTE — Discharge Instructions (Addendum)
Monitor your blood pressure.  Record results and follow-up with your doctor as planned.

## 2023-10-18 NOTE — ED Provider Notes (Signed)
[place] [facility] Provider Note   CSN: [id number] Arrival date & time: 10/18/23  1151     History  Chief Complaint  Patient presents with   Medication Refill   Hypertension    Katherine Salas is a 67 y.o. female.  67 year old female with history of hypertension presents emergency room with concern for elevated blood pressure and out of her medications.  Patient takes lisinopril/HCT, Norvasc, last took her medication yesterday morning.  States that she has been taking half pills trying to stretch her medication.  Is scheduled to establish care with new PCP on December 20.  Patient states last week she could feel a pulsatile sensation in her left cheek and her checked her blood pressure at the pharmacy and it was significantly elevated.  She currently denies any complaints specifically chest pain, shortness of breath.  No other complaints or concerns.       Home Medications Prior to Admission medications   Medication Sig Start Date End Date Taking? Authorizing Provider  amLODipine (NORVASC) 5 MG tablet Take 1 tablet (5 mg total) by mouth daily. Reported on 12/19/2015 10/18/23   Jeannie Fend, PA-C  lisinopril-hydrochlorothiazide (ZESTORETIC) 20-25 MG tablet Take 1 tablet by mouth daily. 10/18/23   Jeannie Fend, PA-C      Allergies    Patient has no known allergies.    Review of Systems   Review of Systems Negative except as per HPI Physical Exam Updated Vital Signs BP (!) 145/89   Pulse 68   Temp 98 F (36.7 C) (Oral)   Resp 18   SpO2 100%  Physical Exam Vitals and nursing note reviewed.  Constitutional:      General: She is not in acute distress.    Appearance: She is well-developed. She is not diaphoretic.  HENT:     Head: Normocephalic and atraumatic.  Cardiovascular:     Rate and Rhythm: Normal rate and regular rhythm.     Heart sounds: Normal heart sounds.  Pulmonary:     Effort: Pulmonary effort is normal.      Breath sounds: Normal breath sounds.  Skin:    General: Skin is warm and dry.     Findings: No erythema or rash.  Neurological:     Mental Status: She is alert and oriented to person, place, and time.  Psychiatric:        Behavior: Behavior normal.     ED Results / Procedures / Treatments   Labs (all labs ordered are listed, but only abnormal results are displayed) Labs Reviewed  BASIC METABOLIC PANEL - Abnormal; Notable for the following components:      Result Value   Glucose, Bld 100 (*)    All other components within normal limits  CBC WITH DIFFERENTIAL/PLATELET    EKG None  Radiology No results found.  Procedures Procedures    Medications Ordered in ED Medications  amLODipine (NORVASC) tablet 5 mg (5 mg Oral Given 10/18/23 1405)    ED Course/ Medical Decision Making/ A&P Clinical Course as of 10/18/23 1440  Mon Oct 18, 2023  1326 Lisinopril/HCT 20/25 Norvasc 5mg  [LM]    Clinical Course User Index [LM] Alden Hipp                                 Medical Decision Making Amount and/or Complexity of Data Reviewed Labs: ordered.  Risk  Prescription drug management.  67 year old female presents with request for refill of her blood pressure medications without acute complaints today.  Her lab work today including CBC and BMP without significant findings.  She is provided with Norvasc here, prescribed her regular medications x 1 month.  Advised to keep blood pressure record and follow-up with her PCP.         Final Clinical Impression(s) / ED Diagnoses Final diagnoses:  Hypertension, unspecified type  Medication refill    Rx / DC Orders ED Discharge Orders          Ordered    amLODipine (NORVASC) 5 MG tablet  Daily        10/18/23 1438    lisinopril-hydrochlorothiazide (ZESTORETIC) 20-25 MG tablet  Daily        10/18/23 1438              Jeannie Fend, PA-C 10/18/23 1440    Virgina Norfolk, DO 10/19/23 0805

## 2023-10-18 NOTE — ED Triage Notes (Signed)
High BP reading , Hx of it , needs medication refill . She has been using half of her dose until her next appointment on December with her new doctor .  Reports pain to neck when she moves her head forward .  Denies chest pain or shortness of breath .  Pt upset in triage because she could not have her prescription refilled .
